# Patient Record
Sex: Male | Born: 1980 | Hispanic: Yes | Marital: Married | Smoking: Never smoker
Health system: Southern US, Community
[De-identification: ages and names within clinical notes are randomized; demographics above are authoritative.]

## PROBLEM LIST (undated history)

## (undated) HISTORY — PX: APPENDECTOMY: SHX54

## (undated) HISTORY — PX: BACK SURGERY: SHX140

---

## 2011-08-19 ENCOUNTER — Emergency Department (HOSPITAL_COMMUNITY): Payer: Worker's Compensation

## 2011-08-19 ENCOUNTER — Other Ambulatory Visit: Payer: Self-pay

## 2011-08-19 ENCOUNTER — Emergency Department (HOSPITAL_COMMUNITY)
Admission: EM | Admit: 2011-08-19 | Discharge: 2011-08-20 | Disposition: A | Discharge status: Home / Self Care | Payer: Worker's Compensation | Attending: Emergency Medicine | Admitting: Emergency Medicine

## 2011-08-19 DIAGNOSIS — M545 Low back pain, unspecified: Secondary | ICD-10-CM | POA: Insufficient documentation

## 2011-08-19 DIAGNOSIS — M542 Cervicalgia: Secondary | ICD-10-CM | POA: Insufficient documentation

## 2011-08-19 DIAGNOSIS — M25559 Pain in unspecified hip: Secondary | ICD-10-CM | POA: Insufficient documentation

## 2011-08-19 DIAGNOSIS — M79609 Pain in unspecified limb: Secondary | ICD-10-CM | POA: Insufficient documentation

## 2011-08-19 DIAGNOSIS — S0100XA Unspecified open wound of scalp, initial encounter: Secondary | ICD-10-CM | POA: Insufficient documentation

## 2011-08-19 DIAGNOSIS — T07XXXA Unspecified multiple injuries, initial encounter: Secondary | ICD-10-CM

## 2011-08-19 DIAGNOSIS — M25552 Pain in left hip: Secondary | ICD-10-CM

## 2011-08-19 DIAGNOSIS — W19XXXA Unspecified fall, initial encounter: Secondary | ICD-10-CM

## 2011-08-19 DIAGNOSIS — S0083XA Contusion of other part of head, initial encounter: Secondary | ICD-10-CM | POA: Insufficient documentation

## 2011-08-19 DIAGNOSIS — R404 Transient alteration of awareness: Secondary | ICD-10-CM | POA: Insufficient documentation

## 2011-08-19 DIAGNOSIS — S0003XA Contusion of scalp, initial encounter: Secondary | ICD-10-CM | POA: Insufficient documentation

## 2011-08-19 DIAGNOSIS — R109 Unspecified abdominal pain: Secondary | ICD-10-CM | POA: Insufficient documentation

## 2011-08-19 DIAGNOSIS — IMO0002 Reserved for concepts with insufficient information to code with codable children: Secondary | ICD-10-CM

## 2011-08-19 DIAGNOSIS — W138XXA Fall from, out of or through other building or structure, initial encounter: Secondary | ICD-10-CM | POA: Insufficient documentation

## 2011-08-19 DIAGNOSIS — R220 Localized swelling, mass and lump, head: Secondary | ICD-10-CM | POA: Insufficient documentation

## 2011-08-19 DIAGNOSIS — M549 Dorsalgia, unspecified: Secondary | ICD-10-CM

## 2011-08-19 DIAGNOSIS — R51 Headache: Secondary | ICD-10-CM | POA: Insufficient documentation

## 2011-08-19 LAB — POCT I-STAT, CHEM 8
BUN: 13 mg/dL (ref 6–23)
Calcium, Ion: 1.2 mmol/L (ref 1.12–1.32)
Glucose, Bld: 105 mg/dL — ABNORMAL HIGH (ref 70–99)
HCT: 45 % (ref 39.0–52.0)
TCO2: 26 mmol/L (ref 0–100)

## 2011-08-19 LAB — CBC
HCT: 41.7 % (ref 39.0–52.0)
MCV: 84.8 fL (ref 78.0–100.0)
Platelets: 263 10*3/uL (ref 150–400)
RBC: 4.92 MIL/uL (ref 4.22–5.81)
RDW: 12.8 % (ref 11.5–15.5)
WBC: 7.9 10*3/uL (ref 4.0–10.5)

## 2011-08-19 LAB — URINALYSIS, MICROSCOPIC ONLY
Bilirubin Urine: NEGATIVE
Nitrite: NEGATIVE
Specific Gravity, Urine: 1.016 (ref 1.005–1.030)
Urobilinogen, UA: 1 mg/dL (ref 0.0–1.0)
pH: 7.5 (ref 5.0–8.0)

## 2011-08-19 LAB — RAPID URINE DRUG SCREEN, HOSP PERFORMED
Amphetamines: NOT DETECTED
Barbiturates: NOT DETECTED
Benzodiazepines: NOT DETECTED

## 2011-08-19 LAB — HEPATIC FUNCTION PANEL
ALT: 55 U/L — ABNORMAL HIGH (ref 0–53)
AST: 35 U/L (ref 0–37)
Albumin: 4.2 g/dL (ref 3.5–5.2)
Total Protein: 8.3 g/dL (ref 6.0–8.3)

## 2011-08-19 LAB — BASIC METABOLIC PANEL
BUN: 12 mg/dL (ref 6–23)
CO2: 25 mEq/L (ref 19–32)
Calcium: 9.6 mg/dL (ref 8.4–10.5)
Creatinine, Ser: 0.78 mg/dL (ref 0.50–1.35)
GFR calc Af Amer: >90 mL/min (ref >90)
GFR calc non Af Amer: >90 mL/min (ref >90)
Glucose, Bld: 100 mg/dL — ABNORMAL HIGH (ref 70–99)

## 2011-08-19 LAB — ETHANOL: Alcohol, Ethyl (B): <11 mg/dL (ref 0–11)

## 2011-08-19 LAB — LACTIC ACID, PLASMA: Lactic Acid, Venous: 1.4 mmol/L (ref 0.5–2.2)

## 2011-08-19 MED ORDER — ONDANSETRON HCL 4 MG/2ML IJ SOLN
4.0000 mg | Freq: Once | INTRAMUSCULAR | Status: AC
  Administered 2011-08-19: 4 mg via INTRAVENOUS
  Filled 2011-08-19: qty 2

## 2011-08-19 MED ORDER — HYDROCODONE-ACETAMINOPHEN 5-325 MG PO TABS
1.0000 | ORAL_TABLET | ORAL | Status: DC | PRN
  Administered 2011-08-20: 1 via ORAL
  Filled 2011-08-19: qty 2

## 2011-08-19 MED ORDER — TETANUS-DIPHTH-ACELL PERTUSSIS 5-2.5-18.5 LF-MCG/0.5 IM SUSP
0.5000 mL | Freq: Once | INTRAMUSCULAR | Status: DC
  Filled 2011-08-19: qty 0.5

## 2011-08-19 MED ORDER — IOHEXOL 300 MG/ML  SOLN
80.0000 mL | Freq: Once | INTRAMUSCULAR | Status: AC | PRN
  Administered 2011-08-19: 80 mL via INTRAVENOUS

## 2011-08-19 MED ORDER — SODIUM CHLORIDE 0.9 % IV BOLUS (SEPSIS)
1000.0000 mL | Freq: Once | INTRAVENOUS | Status: AC
  Administered 2011-08-19: 1000 mL via INTRAVENOUS

## 2011-08-19 MED ORDER — MORPHINE SULFATE 4 MG/ML IJ SOLN
4.0000 mg | Freq: Once | INTRAMUSCULAR | Status: AC
  Administered 2011-08-19: 4 mg via INTRAVENOUS
  Filled 2011-08-19: qty 1

## 2011-08-19 MED ORDER — MORPHINE SULFATE 4 MG/ML IJ SOLN
6.0000 mg | Freq: Once | INTRAMUSCULAR | Status: AC
  Administered 2011-08-19: 6 mg via INTRAVENOUS
  Filled 2011-08-19: qty 2

## 2011-08-19 MED ORDER — MORPHINE SULFATE 4 MG/ML IJ SOLN: 6.0000 mg | Freq: Once | INTRAMUSCULAR | Status: DC

## 2011-08-19 MED ORDER — HYDROCODONE-ACETAMINOPHEN 5-325 MG PO TABS
1.0000 | ORAL_TABLET | Freq: Once | ORAL | Status: AC
  Administered 2011-08-20: 1 via ORAL
  Filled 2011-08-19: qty 1

## 2011-08-19 MED ORDER — MORPHINE SULFATE 4 MG/ML IJ SOLN
6.0000 mg | INTRAMUSCULAR | Status: DC | PRN
  Administered 2011-08-19: 6 mg via INTRAVENOUS
  Filled 2011-08-19: qty 2

## 2011-08-19 MED ORDER — HYDROCODONE-ACETAMINOPHEN 5-500 MG PO TABS
1.0000 | ORAL_TABLET | Freq: Four times a day (QID) | ORAL | Status: DC | PRN
Start: 2011-08-19 — End: 2011-08-26

## 2011-08-19 NOTE — Progress Notes (Signed)
This patient has received 75 ml's of IV Omnipaque 300 (type of contrast) contrast extravasation into LEFT antecubital (part of body) during a CT chest/abd/pelvis  exam.  The exam was performed on (date)08/19/2011   Site / affected area assessed by Dr Jill Poling     and Lysle Morales

## 2011-08-19 NOTE — ED Notes (Signed)
Pt returns from CT.

## 2011-08-19 NOTE — ED Notes (Signed)
Pt transported to radiology.

## 2011-08-19 NOTE — ED Provider Notes (Signed)
History     CSN: [id number]  Arrival date & time 08/19/11  1235   First MD Initiated Contact with Patient 08/19/11 1241      Chief Complaint  Patient presents with  . Fall    (Consider location/radiation/quality/duration/timing/severity/associated sxs/prior treatment) Patient is a 31 y.o. male presenting with fall. The history is provided by the patient and the EMS personnel.  Fall The accident occurred less than 1 hour ago. The fall occurred while walking. Distance fallen: from 2nd story roof  Impact surface: landed on a car then onto the ground. The volume of blood lost was minimal. Point of impact: left side. The pain is present in the head, neck and left hip. The pain is moderate. He was not ambulatory at the scene. There was no entrapment after the fall. Associated symptoms include headaches and loss of consciousness. Pertinent negatives include no visual change, no fever, no numbness, no abdominal pain, no bowel incontinence, no nausea, no vomiting, no hematuria, no hearing loss and no tingling. Exacerbated by: movement. Treatment on scene includes a c-collar and a backboard. He has tried nothing for the symptoms.    No past medical history on file.  No past surgical history on file.  No family history on file.  History  Substance Use Topics  . Smoking status: Not on file  . Smokeless tobacco: Not on file  . Alcohol Use: Not on file      Review of Systems  Constitutional: Negative for fever, chills, activity change, appetite change and fatigue.  HENT: Positive for neck pain. Negative for congestion, sore throat, rhinorrhea and neck stiffness.   Eyes: Negative for photophobia, redness and visual disturbance.  Respiratory: Negative for cough, shortness of breath and wheezing.   Cardiovascular: Negative for chest pain, palpitations and leg swelling.  Gastrointestinal: Negative for nausea, vomiting, abdominal pain, diarrhea, constipation, blood in stool and bowel  incontinence.  Genitourinary: Negative for dysuria, urgency, hematuria and flank pain.  Musculoskeletal: Positive for back pain (low back) and arthralgias (left hip pain).  Skin: Positive for wound (scalp). Negative for rash.  Neurological: Positive for loss of consciousness and headaches. Negative for dizziness, tingling, seizures, facial asymmetry, speech difficulty, weakness, light-headedness and numbness.  Psychiatric/Behavioral: Negative for confusion.  All other systems reviewed and are negative.    Allergies  Review of patient's allergies indicates not on file.  Home Medications  No current outpatient prescriptions on file.  BP 148/88  Temp(Src) 97.1 F (36.2 C) (Oral)  Resp 20  SpO2 98%  Physical Exam  Nursing note and vitals reviewed. Constitutional: He is oriented to person, place, and time. Vital signs are normal. He appears well-developed and well-nourished.  Non-toxic appearance. No distress.  HENT:  Head: Head is with contusion (large contusion and area of soft tissue swelling left posterior upper scalp. ) and with laceration (1.5cm laceration in area of abrasion and soft tissue swelling). Head is without raccoon's eyes and without Battle's sign.    Right Ear: Hearing, tympanic membrane, external ear and ear canal normal. No hemotympanum.  Left Ear: Hearing, tympanic membrane, external ear and ear canal normal. No hemotympanum.  Nose: Nose normal. No nasal deformity or septal deviation.  Mouth/Throat: Oropharynx is clear and moist.  Eyes: Conjunctivae and EOM are normal. Pupils are equal, round, and reactive to light. No scleral icterus.  Neck: No JVD present. Spinous process tenderness (diffuse cervical spine tenderness without deformity) present.  Cardiovascular: Normal rate, regular rhythm, normal heart sounds and intact distal pulses.  Exam reveals no decreased pulses.   No murmur heard. Pulmonary/Chest: Effort normal and breath sounds normal. No respiratory  distress. He has no wheezes. He has no rales.  Abdominal: Soft. Bowel sounds are normal. He exhibits no distension. There is no tenderness. There is no rebound and no guarding.  Genitourinary: Rectum normal, testes normal and penis normal. Rectal exam shows anal tone normal.  Musculoskeletal: Normal range of motion.       Right hip: Normal.       Left hip: He exhibits tenderness (moderate tenderness with palpation of lateral left hip). He exhibits no crepitus, no deformity and no laceration.       Left knee: Normal.       Left ankle: Normal.       Thoracic back: Normal.       Lumbar back: He exhibits bony tenderness (lumbar spine). He exhibits no swelling, no edema, no deformity and no laceration.       Back:       Left upper leg: He exhibits tenderness (with palpation of thigh). He exhibits no swelling, no edema, no deformity and no laceration.       Left lower leg: Normal.  Neurological: He is alert and oriented to person, place, and time. He has normal strength. No cranial nerve deficit. GCS eye subscore is 4. GCS verbal subscore is 5. GCS motor subscore is 6.  Skin: Skin is warm and dry. No rash noted. He is not diaphoretic.  Psychiatric: He has a normal mood and affect.    ED Course  LACERATION REPAIR Date/Time: 08/19/2011 3:38 PM Performed by: Verne Carrow Authorized by: Wyona Almas B Consent: Verbal consent obtained. Risks and benefits: risks, benefits and alternatives were discussed Consent given by: patient Patient understanding: patient states understanding of the procedure being performed Patient consent: the patient's understanding of the procedure matches consent given Procedure consent: procedure consent matches procedure scheduled Site marked: the operative site was marked Imaging studies: imaging studies available Patient identity confirmed: verbally with patient, arm band, provided demographic data and hospital-assigned identification number Body area:  head/neck Location details: scalp Laceration length: 1.5 cm Foreign bodies: no foreign bodies Tendon involvement: none Nerve involvement: none Vascular damage: no Anesthesia: local infiltration Local anesthetic: lidocaine 1% with epinephrine Anesthetic total: 2 ml Patient sedated: no Preparation: Patient was prepped and draped in the usual sterile fashion. Irrigation solution: saline Irrigation method: syringe Amount of cleaning: standard Debridement: none Degree of undermining: none Skin closure: staples (3 staples) Approximation: close Approximation difficulty: simple Patient tolerance: Patient tolerated the procedure well with no immediate complications.   (including critical care time)  Labs Reviewed  BASIC METABOLIC PANEL - Abnormal; Notable for the following:    Glucose, Bld 100 (*)    All other components within normal limits  HEPATIC FUNCTION PANEL - Abnormal; Notable for the following:    ALT 55 (*)    Alkaline Phosphatase 118 (*)    All other components within normal limits  POCT I-STAT, CHEM 8 - Abnormal; Notable for the following:    Glucose, Bld 105 (*)    All other components within normal limits  CBC  URINALYSIS, WITH MICROSCOPIC  LACTIC ACID, PLASMA  PROTIME-INR  URINE RAPID DRUG SCREEN (HOSP PERFORMED)  ETHANOL   Dg Hip Complete Left  08/19/2011  *RADIOLOGY REPORT*  Clinical Data: Status post fall.  Pain.  LEFT HIP - COMPLETE 2+ VIEW  Comparison: None.  Findings: Imaged bones, joints and soft tissues appear normal.  IMPRESSION: Negative exam.  Original Report Authenticated By: Bernadene Bell. Maricela Curet, M.D.   Ct Head Wo Contrast  08/19/2011  *RADIOLOGY REPORT*  Clinical Data:  Status post fall with a blow to the head and a hematoma over the left occiput.  CT HEAD WITHOUT CONTRAST CT CERVICAL SPINE WITHOUT CONTRAST  Technique:  Multidetector CT imaging of the head and cervical spine was performed following the standard protocol without intravenous contrast.   Multiplanar CT image reconstructions of the cervical spine were also generated.  Comparison:  None.  CT HEAD  Findings: Scalp hematoma is seen over the left parietal bone.  No underlying fracture is identified.  The calvarium is intact.  No evidence of acute intracranial abnormality including acute infarction, hemorrhage, mass lesion, mass effect, midline shift or abnormal extra-axial fluid collection.  No hydrocephalus or pneumocephalus.  IMPRESSION: Scalp hematoma over the left parietal bone without underlying fracture or intracranial abnormality.  CT CERVICAL SPINE  Findings: There is no fracture or subluxation of the cervical spine. No epidural hematoma is visualized.  Lung apices are clear. Synostosis between the right 2nd and 1st ribs is noted.  IMPRESSION:  1.  No acute finding. 2.  Synostosis right first and second ribs incidentally noted.  Original Report Authenticated By: Bernadene Bell. Maricela Curet, M.D.   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  Sagittal and coronal MPR images reconstructed from axial data set.  No IV contrast is seen within this patient despite injection.  This is indicative of either internal or external extravasation of contrast material. The patient was examined by Dr. Amil Amen and Jeananne Rama PA. Injection was not repeated.  Contrast: 80mL OMNIPAQUE IOHEXOL 300 MG/ML IV SOLN No oral contrast administered.  Comparison:  None  CT CHEST  Findings: Aorta normal caliber. No mediastinal hematoma. Beam hardening artifacts at lower thorax from inclusion of patient's arms within imaged field. No definite thoracic adenopathy. Dependent atelectasis in posterior lungs bilaterally. Generally low lung volumes.  No definite pulmonary infiltrate, pleural effusion, or pneumothorax. 5 mm left upper lobe pulmonary nodule image 25. No definite fractures.  IMPRESSION: Dependent atelectasis. No additional acute intrathoracic abnormalities. Nonspecific 5 mm left upper lobe pulmonary nodule, recommendation below.  If the patient is at high risk for bronchogenic carcinoma, follow- up chest CT at 6-12 months is recommended.  If the patient is at low risk for bronchogenic carcinoma, follow-up chest CT at 12 months is recommended.  This recommendation follows the consensus statement: Guidelines for Management of Small Pulmonary Nodules Detected on CT Scans: A Statement from the Fleischner Society as published in Radiology 2005; 237:395-400. Online at: DietDisorder.cz.  CT ABDOMEN AND PELVIS  Findings:  Extensive streak artifacts from patient's arms traverse the upper and mid abdomen. Assessment of solid organs limited by lack of IV contrast. Within these limitations, no definite acute focal abnormalities of the liver, spleen, pancreas, kidneys, or adrenal glands. Numerous normal-sized lymph nodes medial to right colon. Bladder unremarkable. No mass, adenopathy, free fluid or inflammatory process. No free intraperitoneal air. No acute fractures identified.  IMPRESSION: No definite acute intra abdominal or intrapelvic abnormalities identified by noncontrast CT as above.  Original Report Authenticated By: Lollie Marrow, M.D.   Ct Cervical  Spine Wo Contrast  08/19/2011  *RADIOLOGY REPORT*  Clinical Data:  Status post fall with a blow to the head and a hematoma over the left occiput.  CT HEAD WITHOUT CONTRAST CT CERVICAL SPINE WITHOUT CONTRAST  Technique:  Multidetector CT imaging of the head and cervical spine was performed following the standard protocol without intravenous contrast.  Multiplanar CT image reconstructions of the cervical spine were also generated.  Comparison:  None.  CT HEAD   Findings: Scalp hematoma is seen over the left parietal bone.  No underlying fracture is identified.  The calvarium is intact.  No evidence of acute intracranial abnormality including acute infarction, hemorrhage, mass lesion, mass effect, midline shift or abnormal extra-axial fluid collection.  No hydrocephalus or pneumocephalus.  IMPRESSION: Scalp hematoma over the left parietal bone without underlying fracture or intracranial abnormality.  CT CERVICAL SPINE  Findings: There is no fracture or subluxation of the cervical spine. No epidural hematoma is visualized.  Lung apices are clear. Synostosis between the right 2nd and 1st ribs is noted.  IMPRESSION:  1.  No acute finding. 2.  Synostosis right first and second ribs incidentally noted.  Original Report Authenticated By: Bernadene Bell. Maricela Curet, M.D.   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  Sagittal and coronal MPR images reconstructed from axial data set.  No IV contrast is seen within this patient despite injection.  This is indicative of either internal or external extravasation of contrast material. The patient was examined by Dr. Amil Amen and Jeananne Rama PA. Injection was not repeated.  Contrast: 80mL OMNIPAQUE IOHEXOL 300 MG/ML IV SOLN No oral contrast administered.  Comparison:  None  CT CHEST  Findings: Aorta normal caliber. No mediastinal hematoma. Beam hardening artifacts at lower thorax from inclusion of patient's arms within imaged field. No definite thoracic adenopathy. Dependent atelectasis in posterior lungs bilaterally. Generally low lung volumes. No definite pulmonary infiltrate, pleural effusion, or pneumothorax. 5 mm left upper lobe pulmonary  nodule image 25. No definite fractures.  IMPRESSION: Dependent atelectasis. No additional acute intrathoracic abnormalities. Nonspecific 5 mm left upper lobe pulmonary nodule, recommendation below.  If the patient is at high risk for bronchogenic carcinoma, follow- up chest CT at 6-12 months is recommended.  If the patient is at low risk for bronchogenic carcinoma, follow-up chest CT at 12 months is recommended.  This recommendation follows the consensus statement: Guidelines for Management of Small Pulmonary Nodules Detected on CT Scans: A Statement from the Fleischner Society as published in Radiology 2005; 237:395-400. Online at: DietDisorder.cz.  CT ABDOMEN AND PELVIS  Findings:  Extensive streak artifacts from patient's arms traverse the upper and mid abdomen. Assessment of solid organs limited by lack of IV contrast. Within these limitations, no definite acute focal abnormalities of the liver, spleen, pancreas, kidneys, or adrenal glands. Numerous normal-sized lymph nodes medial to right colon. Bladder unremarkable. No mass, adenopathy, free fluid or inflammatory process. No free intraperitoneal air. No acute fractures identified.  IMPRESSION: No definite acute intra abdominal or intrapelvic abnormalities identified by noncontrast CT as above.  Original Report Authenticated By: Lollie Marrow, M.D.   Dg Pelvis Portable  08/19/2011  *RADIOLOGY REPORT*  Clinical Data: Fall from roof.  Left  hip and pelvic pain.  PORTABLE PELVIS  Comparison: None.  Findings: The patient is rotated to the left during imaging, mildly reducing diagnostic sensitivity and specificity.  The hips are internally rotated.  Accounting for these factors, no fracture is observed.  IMPRESSION:  1.  No pelvic fracture is observed.  Original Report Authenticated By: Dellia Cloud, M.D.   Dg Chest Portable 1 View  08/19/2011  *RADIOLOGY REPORT*  Clinical Data: Status post fall from the roof with  pain in the left hip and pelvis  PORTABLE CHEST - 1 VIEW  Comparison: None.  Findings: The cardiomediastinal silhouette is within normal limits. The lung fields appear clear with no signs of focal infiltrate or congestive failure. The right costophrenic angle is excluded from the film.  No left pleural fluid is seen.  No definite rib fracture is seen.  Visualized portions of the thoracic spine appear intact.  IMPRESSION: No focal worrisome abnormality identified.  Original Report Authenticated By: Bertha Stakes, M.D.   Dg Knee Complete 4 Views Left  08/19/2011  *RADIOLOGY REPORT*  Clinical Data: Status post fall.  LEFT KNEE - COMPLETE 4+ VIEW  Comparison: None.  Findings: Imaged bones, joints and soft tissues appear normal.  IMPRESSION: Negative exam.  Original Report Authenticated By: Bernadene Bell. D'ALESSIO, M.D.     1. Fall   2. Back pain   3. Left hip pain   4. Multiple contusions   5. Laceration       MDM  30yom with no significant PMH who presents to the ED due to fall from 2nd story roof of building (approx 84ft per EMS). Pt was working on roof lost footing as he tripped over a load of roofing shingles and fell landing on a car initially and subsequently falling to the ground. Per EMS a bystander reported brief LOC, but pt awake alert and following commands the entire time with EMS. He has a headache, neck pain and left hip pain. Laceration with contusion and abrasion to scalp. Pain with palpation of left hip. Pulses intact. Getting trauma scans and plain films of left hip and knee. Will give pain meds and update tetanus.   Called by radiologist. Pt accidentally received infusion of contrast load at IV site in left arm. Radiologist recs monitoring for 12 hours. Radiologist felt like he had a very good exam to read despite lack of adequate contrast infusion. RN elevated arm and applied ice dressing.   CT scans neg for acute injury.   Lac repaired with 3 staples.   At 3:42 PM pt  reassessed and no evidence of compartment syndrome of left arm 2/2 IV contrast.   Sending pt to CDU to monitor his arm to r/o compartment syndrome. Plan to d/c after 12h.        Verne Carrow, MD 08/19/11 [phone]

## 2011-08-19 NOTE — ED Notes (Signed)
Pt roofer, fall from 2 story apt builing, landed on car below, + LOC, alert on EMS arrival , pain in back of head, left hip and thigh

## 2011-08-19 NOTE — ED Notes (Signed)
Family at beside. Family given emotional support.  Pt wife at the bedside

## 2011-08-19 NOTE — ED Provider Notes (Signed)
Assumed patient care from Wyona Almas, MD (resident).  31 year old male who had a recent fall from when tripped on roofing shingles.  Patient has been thoroughly evaluated and had appropriate imagings for the evaluation of his fall. Received IV contrast to his left arm and developed associated swelling. Patient is being sent to the CDU to rule out compartment syndrome. Pain is to keep patient's on elevated for the next 12 hours with periodic neurovascular checks. Plan to discharge at 0400.  5:41 PM Moderate swelling noted to left upper and lower arm. Sensation is intact throughout extremities and to distal fingers with brisk capillary refills. Patient denies any significant pain at this time. Patient is made n.p.o. for the meantime. I will continue to recheck his extremities every 2 hours.  7:25 PM On reevaluation, left forearm with moderate swelling however brisk capillary refill, 2+ radial pulse and sensation is intact. I readjust patient arm and keeps it at a 45 elevation. Ice pack in place. Pain medication given.  9:19 PM On reevaluation, left forearm was mildly decreased swelling subjective pain improvement. Sensation is intact, radial pulse 2+ with brisk capillary refills. Ace wrap was readjusted and less tighten.  Patient is allowed to eat.  11:20 PM Patient endorsed pain pain intensity to his left arm and forearm. On evaluation, same ED edema noted to his left arm and left arm. Sensation is intact throughout. Brisk capillary refills to all distal fingers. 2+ radial pulse. Patient able to make a fist. Pain medication given. Food given.  11:40 PM Patient report given to Dr. Aundra Millet current, who will continue to monitor his care. Anticipate patient to be discharged at 4 AM if his condition improves. Discharge instruction has been prepared.  Fayrene Helper, PA-C 08/19/11 2342

## 2011-08-19 NOTE — ED Notes (Signed)
Pt is able to tolerate po fluids.

## 2011-08-19 NOTE — ED Notes (Signed)
Gave ECG to Dr. Radford Pax after I performed. 1:10 pm JG.

## 2011-08-19 NOTE — ED Notes (Signed)
Ice pack applied to lt forearm, kept lt arm  Elevated on a pillow. Swelling remains on the lt forearm. Will continue to monitor

## 2011-08-19 NOTE — Progress Notes (Signed)
Orthopedic Tech Progress Note Patient Details:  Luis Bartlett December 14, 1980 [id number]      [name] 08/19/2011, 12:44 PMtrauma fall

## 2011-08-19 NOTE — ED Notes (Signed)
Pt resting quietly, reports having pain to left arm 7/10. Pt left arm remains swollen, pt has ace wrap to mid forearm. Pt has palpable strong radial pulse and cap refill > 2 seconds. Ace wrap checked for tightness and is stretchable for movement. Pt has ice pack applied to arm with verbal understanding of intermittently placement. Plan of care is updated with verbal understanding, will continue to monitor pt.

## 2011-08-19 NOTE — ED Notes (Signed)
Pt has no increase in swelling to left arm, new ice pack given and pt continues to have arm elevated. Pt continues to have good capillary refill and palpable radial pulse. Pt denies any numbness to arm, family at bedside and will continue to monitor pt.

## 2011-08-19 NOTE — ED Notes (Signed)
Pt continues to be in radiology 

## 2011-08-20 MED ORDER — HYDROCODONE-ACETAMINOPHEN 5-325 MG PO TABS: 1.0000 | ORAL_TABLET | Freq: Four times a day (QID) | ORAL | Status: DC | PRN

## 2011-08-20 NOTE — ED Provider Notes (Signed)
I saw and evaluated the patient, reviewed the resident's note and I agree with the findings and plan.   .Face to face Exam:  General:  Awake HEENT:  Atraumatic Resp:  Normal effort Abd:  Nondistended Neuro:No focal weakness Lymph: No adenopathy   Nelia Shi, MD 08/20/11 2013

## 2011-08-20 NOTE — ED Provider Notes (Signed)
Medical screening examination/treatment/procedure(s) were performed by non-physician practitioner and as supervising physician I was immediately available for consultation/collaboration.   Loren Racer, MD 08/20/11 0001

## 2011-08-20 NOTE — ED Notes (Signed)
Patient states swelling to left hand has gotten worse. Rates the pain as 5/10. Throbbing with numbness and tingling. Also complaining of head pain in occipital lobe. Swelling, raised area noted with dry blood

## 2011-08-20 NOTE — ED Provider Notes (Signed)
Patient was reassessed and continued to feel improvement. Patient strong pulses and sensation intact. He was discharged in good condition with pain medication.  Cyndra Numbers, MD 08/20/11 [phone]

## 2011-08-20 NOTE — ED Notes (Addendum)
Ace wrap removed. Decreased swelling to hand noted. Patient also states tightness has decreased. Forearm firm to palpation.  Complaining of throbbing pain in head.

## 2011-08-25 ENCOUNTER — Emergency Department: Payer: Self-pay | Admitting: Emergency Medicine

## 2011-08-26 ENCOUNTER — Emergency Department (HOSPITAL_COMMUNITY)
Admission: EM | Admit: 2011-08-26 | Discharge: 2011-08-26 | Disposition: A | Discharge status: Home / Self Care | Payer: Worker's Compensation | Attending: Emergency Medicine | Admitting: Emergency Medicine

## 2011-08-26 ENCOUNTER — Encounter (HOSPITAL_COMMUNITY): Payer: Self-pay

## 2011-08-26 ENCOUNTER — Emergency Department (HOSPITAL_COMMUNITY): Payer: Worker's Compensation

## 2011-08-26 DIAGNOSIS — R42 Dizziness and giddiness: Secondary | ICD-10-CM | POA: Insufficient documentation

## 2011-08-26 DIAGNOSIS — Z79899 Other long term (current) drug therapy: Secondary | ICD-10-CM | POA: Insufficient documentation

## 2011-08-26 DIAGNOSIS — W138XXA Fall from, out of or through other building or structure, initial encounter: Secondary | ICD-10-CM | POA: Insufficient documentation

## 2011-08-26 DIAGNOSIS — R112 Nausea with vomiting, unspecified: Secondary | ICD-10-CM | POA: Insufficient documentation

## 2011-08-26 DIAGNOSIS — R51 Headache: Secondary | ICD-10-CM | POA: Insufficient documentation

## 2011-08-26 DIAGNOSIS — S0003XA Contusion of scalp, initial encounter: Secondary | ICD-10-CM | POA: Insufficient documentation

## 2011-08-26 DIAGNOSIS — S060X9A Concussion with loss of consciousness of unspecified duration, initial encounter: Secondary | ICD-10-CM | POA: Insufficient documentation

## 2011-08-26 DIAGNOSIS — S060XAA Concussion with loss of consciousness status unknown, initial encounter: Secondary | ICD-10-CM | POA: Insufficient documentation

## 2011-08-26 DIAGNOSIS — S0083XA Contusion of other part of head, initial encounter: Secondary | ICD-10-CM | POA: Insufficient documentation

## 2011-08-26 MED ORDER — MECLIZINE HCL 25 MG PO TABS
50.0000 mg | ORAL_TABLET | Freq: Once | ORAL | Status: AC
  Administered 2011-08-26: 50 mg via ORAL
  Filled 2011-08-26: qty 2

## 2011-08-26 MED ORDER — MECLIZINE HCL 25 MG PO TABS: 25.0000 mg | ORAL_TABLET | Freq: Three times a day (TID) | ORAL | Status: AC | PRN

## 2011-08-26 MED ORDER — CYCLOBENZAPRINE HCL 10 MG PO TABS: 10.0000 mg | ORAL_TABLET | Freq: Two times a day (BID) | ORAL | Status: AC | PRN

## 2011-08-26 NOTE — ED Notes (Signed)
C/o h/a, dizziness, and n/v for several days. Pt was seen in dept on 2/5 after falling off a roof and having laceration stapled on the posterior head. Pt states sx persisted after that visit. States can't keep anything PO down. States very dizzy when getting up and laying down. Also c/o persistent h/a. Pt is neurologically intact and is negative for stroke. Pt ambulatory and appears in no acute distress, will cont to monitor.

## 2011-08-26 NOTE — ED Provider Notes (Signed)
CT results reviewed and discussed with patient.  Dizziness improved with meclizine.  Will discharge home with prescription for meclizine and flexeril for muscle spasms in leg.  Jimmye Norman, NP 08/26/11 [phone]

## 2011-08-26 NOTE — ED Notes (Signed)
Pt complains of dizziness and headache for four days.

## 2011-08-26 NOTE — ED Notes (Signed)
Pt reports that he still feels dizzy and rates headache at 4/10.  Denies N/V

## 2011-08-26 NOTE — ED Notes (Signed)
Pt reports that he still has a ltitle dizziness, headache rated at 4/10 and mild nausea.  No emesis noted at this time.

## 2011-08-26 NOTE — ED Provider Notes (Signed)
History     CSN: [id number]  Arrival date & time 08/26/11  1236   First MD Initiated Contact with Patient 08/26/11 1452      Chief Complaint  Patient presents with  . Dizziness  . Headache    (Consider location/radiation/quality/duration/timing/severity/associated sxs/prior treatment) Patient is a 31 y.o. male presenting with headaches. The history is provided by the patient. The history is limited by a language barrier. No language interpreter was used.  Headache  This is a new problem. The problem occurs constantly. Associated symptoms include nausea and vomiting. Pertinent negatives include no fever.  Pt states he fell off the roof a week ago. Was evaluated at that time with x-rays and CT scan of the head. Was discharged home with pain medications  After negative results. Pt states since then, his head continues to hurt, mainly on left side. Reports nausea, vomiting, dizziness.  Denies new injuries. States pain medications not helping  History reviewed. No pertinent past medical history.  History reviewed. No pertinent past surgical history.  History reviewed. No pertinent family history.  History  Substance Use Topics  . Smoking status: Never Smoker   . Smokeless tobacco: Not on file  . Alcohol Use: Yes      Review of Systems  Constitutional: Negative for fever and chills.  Eyes: Negative for pain and visual disturbance.  Respiratory: Negative.   Cardiovascular: Negative.   Gastrointestinal: Positive for nausea and vomiting.  Genitourinary: Negative.   Musculoskeletal: Negative.   Skin: Negative.   Neurological: Positive for dizziness, light-headedness and headaches.  Psychiatric/Behavioral: Negative.     Allergies  Review of patient's allergies indicates no known allergies.  Home Medications   Current Outpatient Rx  Name Route Sig Dispense Refill  . CYCLOBENZAPRINE HCL 10 MG PO TABS Oral Take 10 mg by mouth every 6 (six) hours as needed. For muscle spasms     . HYDROCODONE-ACETAMINOPHEN 5-325 MG PO TABS Oral Take 1-2 tablets by mouth every 6 (six) hours as needed. For pain    . MELOXICAM 7.5 MG PO TABS Oral Take 7.5 mg by mouth 2 (two) times daily as needed. For pain      BP 137/95  Pulse 113  Temp(Src) 97.4 F (36.3 C) (Oral)  Resp 16  SpO2 98%  Physical Exam  Nursing note and vitals reviewed. Constitutional: He is oriented to person, place, and time. He appears well-developed and well-nourished. No distress.  HENT:  Head: Normocephalic.  Right Ear: External ear normal.  Left Ear: External ear normal.  Nose: Nose normal.  Mouth/Throat: Oropharynx is clear and moist.       Hematoma to the left scalp, tender to palpation.   Eyes: Conjunctivae and EOM are normal. Pupils are equal, round, and reactive to light.  Neck: Normal range of motion. Neck supple.  Cardiovascular: Normal rate, regular rhythm and normal heart sounds.   Pulmonary/Chest: Effort normal and breath sounds normal. No respiratory distress.  Abdominal: Soft. Bowel sounds are normal. He exhibits no distension. There is no tenderness.  Musculoskeletal: Normal range of motion. He exhibits no edema and no tenderness.  Lymphadenopathy:    He has no cervical adenopathy.  Neurological: He is alert and oriented to person, place, and time. He has normal reflexes. No cranial nerve deficit. Coordination normal.       Normal finger to nose, no pronator drift.   Skin: Skin is warm and dry. No erythema.  Psychiatric: He has a normal mood and affect.    ED  Course  Procedures (including critical care time)  Pt with head injury 5 days ago. Continues to have severe headache, dizziness. Will repeat CT head.   Pt's care discussed with PA Katrinka Blazing at change of shift. Pt has no neuro findings.   No diagnosis found.    MDM          Lottie Mussel, PA 08/27/11 [phone]

## 2011-08-26 NOTE — Discharge Instructions (Signed)
Your CT did not show any abnormal findings. Continue ibuprofen and pain medications prescribed. Rest. Follow up with your primary care doctor.    Contusin y lesin cerebral (Concussion and Brain Injury)  Un golpe o una sacudida en la cabeza puede afectar el normal funcionamiento del cerebro. Este tipo de lesin se denomina "conmocin cerebral" o "traumatismo craneal cerrado" Este tipo de contusin no pone en peligro su vida. An as, los efectos de una contusin pueden ser graves.  CAUSAS Una concusin se produce por un traumatismo en la cabeza. El golpe puede ser directo o indirecto como se describe a continuacin.  Golpe directo (chocar con otro jugador en un partido de ftbol, un golpe en una pelea, golpearse contra una superficie dura).   Un golpe indirecto (la cabeza se Walt Disney rpida y violentamente de adelante hacia atrs, como en un choque automovilstico).  SNTOMAS El cerebro es muy complejo. Cada lesin de la cabeza es diferente. Algunos sntomas pueden aparecer de inmediato. Otros sntomas pueden no aparecer [employer] o semanas despus de la contusin. Los signos pueden ser difciles de Chief Strategy Officer. En un primer momento, los pacientes, familiares y profesionales tal vez los adviertan. Puede tener un aspecto saludable y Research scientist (life sciences) o sentir de modo diferente.  Los sntomas son temporarios pero generalmente duran [employer], semanas o ms. Los sntomas son:  Cefaleas leves que no se alivian.   Presentar ms dificultad que lo habitual para:   Recordar cosas.   Prestar atencin o concentrarse.   Organizar las tareas diarias.   Tomar decisiones y [employer].   Lentitud para pensar, actuar, hablar o leer.   Sentirse perdido o confuso.   Sentirse cansado [employer], falta de Engineer, drilling (fatiga).   Sentirse somnoliento.   Problemas para dormir.   Dormir ms tiempo que el habitual.   Duerme ms que lo habitual.   Problemas para conciliar el sueo.   Problemas para  dormir (insomnio).   Prdida del equilibrio, mareos.   Nuseas o vmitos.   Adormecimiento u hormigueo.   Mayor sensibilidad para:   Los ruidos.   La luz.   Las distracciones.  Entre otros sntomas se incluyen:  Problemas de la visin o cansancio en los ojos.   Prdida del sentido del gusto o Cabin crew.   Ruidos en el odo.   Cambios en el humor como sentirse triste, ansioso o indiferente.   Irritacin, enojo por cosas pequeas o sin motivos.   Falta de motivacin.  DIAGNSTICO El mdico diagnosticar conmocin cerebral o lesin cerebral leve basndose en la descripcin del traumatismo y los sntomas.  La evaluacin puede incluir:  Un escaneo cerebral para buscar seales de lesiones en el cerebro. Incluso si en el examen no se observan lesiones, podra tener una contusin.   Anlisis de sangre para asegurarse de que no existe otro problema.  TRATAMIENTO  Las personas que han sufrido una conmocin cerebral deben ser examinadas y [place]. La mayor parte de las personas que sufren conmocin cerebral son tratados en el servicio de emergencias o en el consultorio mdico. Training and development officer en el hospital por una noche para recibir un tratamiento ms intensivo.   El profesional le dar el alta con algunas instrucciones que deber seguir. Asegrese de seguirlas cuidadosamente.   Comunquele al profesional si toma medicamentos (prescriptos, de venta libre o "naturales") o si bebe alcohol o consume drogas ilegales. Tambin informe al profesional si toma anticoagulantes o aspirina. Estos medicamentos pueden aumentar la probabilidad de que existan complicaciones.  Esta es informacin importante que podra Licensed conveyancer.   Utilice los medicamentos de venta libre o de prescripcin para Chief Technology Officer, Environmental health practitioner o la [place], segn se lo indique el profesional que lo asiste.  PRONSTICO El tiempo que lleve la recuperacin depende de cada persona. Aunque la [employer]son  de las personas se recupera satisfactoriamente, la mejora depende de varios factores. Entre ellos se incluyen la gravedad de la contusin, la zona del cerebro lesionada, la edad y [place] de salud previo a la lesin.  Como todas las lesiones de la cabeza son [place], tambin lo es la recuperacin. La Harley[employer]son de las personas con lesiones leves se recupera por completo. La recuperacin puede demorar algn tiempo. En general, la recuperacin es ms lenta en las [employer]. Adems, a aquellas personas que ya han sufrido una contusin en el pasado, les llevar ms tiempo recuperarse de la lesin actual. La ansiedad y la depresin podrn tambin hacer ms difcil de superar los sntomas de la lesin cerebral. INSTRUCCIONES PARA EL CUIDADO DOMICILIARIO El regreso a las actividades habituales debe ser gradual. El cuerpo y el cerebro necesitan su tiempo para recuperarse.  Deben dormir las horas suficientes durante la noche y Systems analyst. El reposo ayuda al cerebro a curarse.   Evite quedarse despierto muy tarde por la noche.   Debe irse a dormir a la V[employer] Corporation de [address] y los fines de [place].   Promueva las siestas durante el da o momentos de descanso cuando parece cansado.   Limite las [employer] le exijan mucha concentracin (reposo cognitivo o cerebral). Aqu se incluye:   Tareas para el hogar o trabajos relacionados con el empleo.   Mirar televisin.   Trabajos en la computadora.   Deber evitar las actividades que puedan favorecer otra lesin, tales como los deportes de contacto o Teacher, adult education, a International aid/development worker profesional le indique que puede Journalist, newspaper. An despus de la Psychologist, educational, debe protegerse de una nueva lesin.   Pregunte cuando podr volver a conducir un automvil, una bicicleta u manipular equipos riesgosos. La capacidad para reaccionar puede ser ms lenta luego de una lesin cerebral.   Converse con el profesional acerca del mejor  momento para que retome la actividad escolar o [place].   Informe a los [employer], al departamento de enfermera de la escuela, al consejero escolar, Conservation officer, historic buildings acerca de los sntomas y Engineer, structural que tiene. Ellos deben ser instruidos para informar:   Aumento en los problemas de atencin o Librarian, academic.   Aumento en los problemas de memoria o en el aprendizaje de informacin nueva.   Necesita ms tiempo para completar tareas o encargos.   Aumento de la irritabilidad o disminucin de la capacidad para Social worker.   Aparecen nuevos sntomas.   Tome slo aquellos medicamentos que le han autorizado.   No beba alcohol hasta que el profesional le indique que ya no corre Surveyor, minerals. El alcohol y ciertas drogas podrn Surveyor, minerals recuperacin y ponerlo en riesgo de otra lesin.   Si le resulta ms difcil que lo habitual recordar las cosas, escrbalas.   Si se distrae fcilmente, trate de hacer una cosa a la vez. Por ejemplo, trate de no mirar televisin mientras planea la cena.   Consulte con familiares y amigos si debe tomar decisiones importantes.   Cumpla con las visitas de control tal como se le indic. Se recomienda realizar varias evaluaciones de los sntomas para favorecer su recuperacin.  PREVENCIN Proteja su cabeza de traumatismos futuros. Es muy importante que evite otro golpe en la cabeza antes de la recuperacin. En casos raros, un nuevo traumatismo ha ocasionado daos cerebrales permanentes, hinchazn del cerebro y [employer]. Evite los traumatismos usando:   Cinturones de seguridad al viajar en automvil.   Beba alcohol con moderacin.   Utilice cascos al andar en bicicleta, esquiar, patinar en tabla, usar patines o al [employer] Corporation similares.   Medidas de seguridad en el hogar.   Para evitar tropezar no tenga desorden en pisos y escaleras.   Coloque barras en los baos y pasamanos en las escaleras.   Ponga alfombras  antideslizantes en pisos y baeras.   Mejore la iluminacin en zonas de penumbra.  SOLICITE ATENCIN MDICA SI: Una lesin en la cabeza puede ocasionar daos permanentes. Deber consultar con el mdico si ha tenido algunos de lo siguientes sntomas por ms de 3 semanas despus de la lesin o planea volver a hacer deportes:  Dolor de cabeza crnico.   Mareos o problemas para mantener el equilibrio.   Nuseas.   Problemas de la visin.   Mayor sensibilidad a ruidos o luces.   Depresin o cambios de humor.   Ansiedad o irritabilidad.   Problemas de memoria.   Dificultad para concentrase o prestar atencin.   Problemas para dormir.   Sentirse cansado [employer].  SOLICITE ATENCIN MDICA DE INMEDIATO SI: Ha sufrido un golpe o sacudida en la cabeza y usted (o sus familiares o amigos) notan:  Cefalea que empeora.   Debilitamiento (incluso de [place], pierna o una parte de la cara), adormecimiento o falta de coordinacin.   Vmitos a repeticin.   Mucho sueo o desmayos.   Uno de los centros negros del ojo (pupila) es ms grande que el otro.   Presenta convulsiones (temblores).   Habla arrastrando las palabras.   Aumento de la confusin, la agitacin o la irritabilidad.   Imposibilidad de Nutritional therapist o lugares.   Dolor en el cuello.   Dificultad para despertarse.   Cambios no habituales en la conducta.   Prdida de la conciencia  Los adultos [employer] con lesin cerebral poseen un mayor riesgo de Warehouse manager complicaciones graves como cogulos en el cerebro. Los dolores de cabeza que empeoran o un aumento en la confusin son sntomas de esta complicacin. Si aparecen estos sntomas vea a un mdico de inmediato. EST SEGURO QUE:   Comprende las instrucciones para el alta mdica.   Controlar su enfermedad.   Solicitar atencin mdica de inmediato segn las indicaciones.  PARA OBTENER MS INFORMACIN Hay grupos de ayuda para las personas que han sufrido una  lesin cerebral y su familia. Ellos proporcionan informacin y ayudan a la gente a ponerse en contacto con las fuentes de  locales. Estos incluyen grupos de apoyo, servicios de rehabilitacin y [place] gran variedad de profesionales del cuidado de Radiographer, therapeutic. [employer], la Brain Injury Association (BIA www.biausa.org) cuenta con oficinas en todo el pas que renen informacin cientfica y [place] y trabaja a nivel nacional para ayudar a las personas que han sufrido una lesin cerebral.  Document Released: 06/12/2008 Document Revised: 03/12/2011 [facility] Patient Information 2012 [place], [place]. Concussion and Brain Injury A blow or jolt to the head can disrupt the normal function of the brain. This type of brain injury is often called a "concussion" or a "closed head injury." Concussions are usually not life-threatening. Even so, the effects of a concussion can be serious.  CAUSES  A concussion is caused by a blunt blow to the head. The blow might be direct or indirect as described below.  Direct blow (running into another player during a soccer game, being hit in a fight, or hitting your head on a hard surface).   Indirect blow (when your head moves rapidly and violently back and forth like in a car crash).  SYMPTOMS  The brain is very complex. Every head injury is different. Some symptoms may appear right away. Other symptoms may not show up for days or weeks after the concussion. The signs of concussion can be hard to notice. Early on, problems may be missed by patients, family members, and caregivers. You may look fine even though you are acting or feeling differently.  These symptoms are usually temporary, but may last for days, weeks, or even longer. Symptoms include:  Mild headaches that will not go away.   Having more trouble than usual with:   Remembering things.   Paying attention or concentrating.   Organizing daily tasks.   Making decisions and solving problems.    Slowness in thinking, acting, speaking, or reading.   Getting lost or easily confused.   Feeling tired all the time or lacking energy (fatigue).   Feeling drowsy.   Sleep disturbances.   Sleeping more than usual.   Sleeping less than usual.   Trouble falling asleep.   Trouble sleeping (insomnia).   Loss of balance or feeling lightheaded or dizzy.   Nausea or vomiting.   Numbness or tingling.   Increased sensitivity to:   Sounds.   Lights.   Distractions.  Other symptoms might include:  Vision problems or eyes that tire easily.   Diminished sense of taste or smell.   Ringing in the ears.   Mood changes such as feeling sad, anxious, or listless.   Becoming easily irritated or angry for little or no reason.   Lack of motivation.  DIAGNOSIS  Your caregiver can usually diagnose a concussion or mild brain injury based on your description of your injury and your symptoms.  Your evaluation might include:  A brain scan to look for signs of injury to the brain. Even if the test shows no injury, you may still have a concussion.   Blood tests to be sure other problems are not present.  TREATMENT   People with a concussion need to be examined and evaluated. Most people with concussions are treated in an emergency department, urgent care, or clinic. Some people must stay in the hospital overnight for further treatment.   Your caregiver will send you home with important instructions to follow. Be sure to carefully follow them.   Tell your caregiver if you are already taking any medicines (prescription, over-the-counter, or natural remedies), or if you are drinking alcohol or taking illegal drugs. Also, talk with your caregiver if you are taking blood thinners (anticoagulants) or aspirin. These drugs may increase your chances of complications. All of this is important information that may affect treatment.   Only take over-the-counter or prescription medicines for pain,  discomfort, or fever as directed by your caregiver.  PROGNOSIS  How fast people recover from brain injury varies from person to person. Although most people have a good recovery, how quickly they improve depends on many factors. These factors include how severe their concussion was, what part of the brain was injured, their age, and how healthy they were before the concussion.  Because all head injuries are different, so is  recovery. Most people with mild injuries recover fully. Recovery can take time. In general, recovery is slower in older persons. Also, persons who have had a concussion in the past or have other medical problems may find that it takes longer to recover from their current injury. Anxiety and depression may also make it harder to adjust to the symptoms of brain injury. HOME CARE INSTRUCTIONS  Return to your normal activities slowly, not all at once. You must give your body and brain enough time for recovery.  Get plenty of sleep at night, and rest during the day. Rest helps the brain to heal.   Avoid staying up late at night.   Keep the same bedtime hours on weekends and weekdays.   Take daytime naps or rest breaks when you feel tired.   Limit activities that require a lot of thought or concentration (brain or cognitive rest). This includes:   Homework or job-related work.   Watching TV.   Computer work.   Avoid activities that could lead to a second brain injury, such as contact or recreational sports, until your caregiver says it is okay. Even after your brain injury has healed, you should protect yourself from having another concussion.   Ask your caregiver when you can return to your normal activities such as driving, bicycling, or operating heavy equipment. Your ability to react may be slower after a brain injury.   Talk with your caregiver about when you can return to work or school.   Inform your teachers, school nurse, school counselor, coach, Event organiser,  or work Production designer, theatre/television/film about your injury, symptoms, and restrictions. They should be instructed to report:   Increased problems with attention or concentration.   Increased problems remembering or learning new information.   Increased time needed to complete tasks or assignments.   Increased irritability or decreased ability to cope with stress.   Increased symptoms.   Take only those medicines that your caregiver has approved.   Do not drink alcohol until your caregiver says you are well enough to do so. Alcohol and certain other drugs may slow your recovery and can put you at risk of further injury.   If it is harder than usual to remember things, write them down.   If you are easily distracted, try to do one thing at a time. For example, do not try to watch TV while fixing dinner.   Talk with family members or close friends when making important decisions.   Keep all follow-up appointments. Repeated evaluation of your symptoms is recommended for your recovery.  PREVENTION  Protect your head from future injury. It is very important to avoid another head or brain injury before you have recovered. In rare cases, another injury has lead to permanent brain damage, brain swelling, or death. Avoid injuries by using:  Seatbelts when riding in a car.   Alcohol only in moderation.   A helmet when biking, skiing, skateboarding, skating, or doing similar activities.   Safety measures in your home.   Remove clutter and tripping hazards from floors and stairways.  Use g MEDICAL CARE IF:  A head injury can cause lingering symptoms. You should seek medical care if you have any of the following symptoms for more than 3 weeks after your injury or are planning to return to sports:  Chronic headaches.   Dizziness or balance problems.   Nausea.   Vision problems.   Increased sensitivity to noise or light.   Depression or mood swings.  Anxiety or irritability.   Memory problems.    Difficulty concentrating or paying attention.   Sleep problems.   Feeling tired all the time.  SEEK IMMEDIATE MEDICAL CARE IF:  You have had a blow or jolt to the head and you (or your family or friends) notice:  Severe or worsening headaches.   Weakness (even if only in one hand or one leg or one part of the face), numbness, or decreased coordination.   Repeated vomiting.   Increased sleepiness or passing out.   One black center of the eye (pupil) is larger than the other.   Convulsions (seizures).   Slurred speech. rab bars in bathrooms and handrails by stairs.   Place non-slip mats on floors and in bathtubs.   Improve lighting in dim areas.   SEEK  Increasing confusion, restlessness, agitation, or irritability.   Lack of ability to recognize people or places.   Neck pain.   Difficulty being awakened.   Unusual behavior changes.   Loss of consciousness.  Older adults with a brain injury may have a higher risk of serious complications such as a blood clot on the brain. Headaches that get worse or an increase in confusion are signs of this complication. If these signs occur, see a caregiver right away. MAKE SURE YOU:   Understand these instructions.   Will watch your condition.   Will get help right away if you are not doing well or get worse.  FOR MORE INFORMATION  Several groups help people with brain injury and their families. They provide information and put people in touch with local resources. These include support groups, rehabilitation services, and a variety of health care professionals. Among these groups, the Brain Injury Association (BIA, www.biausa.org) has a Secretary/administrator that gathers scientific and educational information and works on a national level to help people with brain injury.  Document Released: 09/20/2003 Document Revised: 03/12/2011 Document Reviewed: 02/16/2008 [facility] Patient Information 2012 [place], [place].

## 2011-08-26 NOTE — ED Notes (Signed)
Pt states that he is continuing to have headache and dizziness.

## 2011-08-28 NOTE — ED Provider Notes (Signed)
Medical screening examination/treatment/procedure(s) were performed by non-physician practitioner and as supervising physician I was immediately available for consultation/collaboration.  Antoine Vandermeulen P Ashlin Kreps, MD 08/28/11 1531 

## 2011-08-30 NOTE — ED Provider Notes (Signed)
Evaluation and management procedures were performed by me prior to passing care of the patient to the mid-level provider in the CDU. Patient management continued under my collaboration and supervision as performed by the mid-level provider.   Felisa Bonier, MD 08/30/11 [phone]

## 2018-12-30 ENCOUNTER — Telehealth: Payer: Self-pay | Admitting: *Deleted

## 2018-12-30 DIAGNOSIS — Z20822 Contact with and (suspected) exposure to covid-19: Secondary | ICD-10-CM

## 2018-12-30 NOTE — Telephone Encounter (Signed)
Referred by [facility] Department for [phone] testing due to having symptoms. No answer, left VM to return call to make appointment time at the [place] site. Lab order already placed. No PCP No insurance.

## 2019-01-06 ENCOUNTER — Emergency Department
Admission: EM | Admit: 2019-01-06 | Discharge: 2019-01-06 | Disposition: A | Discharge status: Home / Self Care | Payer: Self-pay | Attending: Emergency Medicine | Admitting: Emergency Medicine

## 2019-01-06 ENCOUNTER — Encounter: Payer: Self-pay | Admitting: Emergency Medicine

## 2019-01-06 ENCOUNTER — Emergency Department: Payer: Self-pay

## 2019-01-06 ENCOUNTER — Other Ambulatory Visit: Payer: Self-pay

## 2019-01-06 DIAGNOSIS — R509 Fever, unspecified: Secondary | ICD-10-CM | POA: Insufficient documentation

## 2019-01-06 DIAGNOSIS — R05 Cough: Secondary | ICD-10-CM | POA: Insufficient documentation

## 2019-01-06 DIAGNOSIS — R059 Cough, unspecified: Secondary | ICD-10-CM

## 2019-01-06 DIAGNOSIS — R0602 Shortness of breath: Secondary | ICD-10-CM | POA: Insufficient documentation

## 2019-01-06 DIAGNOSIS — U071 COVID-19: Secondary | ICD-10-CM | POA: Insufficient documentation

## 2019-01-06 LAB — CBC
HCT: 45.5 % (ref 39.0–52.0)
Hemoglobin: 15.5 g/dL (ref 13.0–17.0)
MCH: 29.1 pg (ref 26.0–34.0)
MCHC: 34.1 g/dL (ref 30.0–36.0)
MCV: 85.5 fL (ref 80.0–100.0)
Platelets: 212 10*3/uL (ref 150–400)
RBC: 5.32 MIL/uL (ref 4.22–5.81)
RDW: 12.6 % (ref 11.5–15.5)
WBC: 5.8 10*3/uL (ref 4.0–10.5)
nRBC: 0 % (ref 0.0–0.2)

## 2019-01-06 LAB — BASIC METABOLIC PANEL
Anion gap: 11 (ref 5–15)
BUN: 13 mg/dL (ref 6–20)
CO2: 23 mmol/L (ref 22–32)
Calcium: 8.9 mg/dL (ref 8.9–10.3)
Chloride: 101 mmol/L (ref 98–111)
Creatinine, Ser: 0.94 mg/dL (ref 0.61–1.24)
GFR calc Af Amer: >60 mL/min (ref >60)
GFR calc non Af Amer: >60 mL/min (ref >60)
Glucose, Bld: 110 mg/dL — ABNORMAL HIGH (ref 70–99)
Potassium: 3.7 mmol/L (ref 3.5–5.1)
Sodium: 135 mmol/L (ref 135–145)

## 2019-01-06 LAB — TROPONIN I (HIGH SENSITIVITY): Troponin I (High Sensitivity): <2 ng/L (ref <18)

## 2019-01-06 NOTE — ED Triage Notes (Signed)
Pt presents to ED c/o SOB and chest pain. Positive COVID test 8 days ago.

## 2019-01-06 NOTE — ED Notes (Signed)
Pt ambulatory from triage, sitting in bed speaking with this RN via ipad interpretor # [id number]. Pt reports he was tested for covid 8 days ago at a clinic that he does not know the name of. Here today because he is experiencing SOB and CP.

## 2019-01-06 NOTE — ED Provider Notes (Signed)
[facility] Emergency Department Provider Note  ____________________________________________   I have reviewed the triage vital signs and the nursing notes. Where available I have reviewed prior notes and, if possible and indicated, outside hospital notes.    HISTORY  Chief Complaint Chest Pain and Shortness of Breath (COVID POS)  Patient seen and evaluated during the coronavirus epidemic during a time with low staffing patient speaks Spanish, he is very comfortable with my Spanish he does not want me to call for an interpreter at this time, he states that if he changes his mind he will let me know.  he feels that there are no barriers to communication between us at this time  HPI Luis Bartlett is a 38 y.o. male with a history of recent diagnosis of coronavirus as an outpatient, about 7 days ago he states.  He has continued low-grade fever and cough at home.  Nonproductive cough.  And sometimes is uncomfortable to his left chest wall when he coughs.  No other complaints.  Pain is nonradiating gets better when he takes Tylenol.  He is self quarantining he understands he must continue to do so.  Nonradiating cough elicited chest wall discomfort on the left side.   Has been there for a few days but only when he coughs.  No exertional symptoms otherwise feels pretty good otherwise he states  History reviewed. No pertinent past medical history.  There are no active problems to display for this patient.   History reviewed. No pertinent surgical history.  Prior to Admission medications   Medication Sig Start Date End Date Taking? Authorizing Provider  cyclobenzaprine (FLEXERIL) 10 MG tablet Take 10 mg by mouth every 6 (six) hours as needed. For muscle spasms    [provider]  HYDROcodone-acetaminophen (NORCO) 5-325 MG per tablet Take 1-2 tablets by mouth every 6 (six) hours as needed. For pain    Hunt, Meagan, MD  meloxicam (MOBIC) 7.5 MG tablet Take 7.5 mg  by mouth 2 (two) times daily as needed. For pain    [provider]    Allergies Patient has no known allergies.  History reviewed. No pertinent family history.  Social History Social History   Tobacco Use  . Smoking status: Never Smoker  . Smokeless tobacco: Never Used  Substance Use Topics  . Alcohol use: Yes  . Drug use: No    Review of Systems Constitutional: + fever Eyes: No visual changes. ENT: No sore throat. No stiff neck no neck pain Cardiovascular: Denies chest pain. Respiratory: Denies shortness of breath. Gastrointestinal:   no vomiting.  No diarrhea.  No constipation. Genitourinary: Negative for dysuria. Musculoskeletal: Negative lower extremity swelling Skin: Negative for rash. Neurological: Negative for severe headaches, focal weakness or numbness.   ____________________________________________   PHYSICAL EXAM:  VITAL SIGNS: ED Triage Vitals  Enc Vitals Group     BP 01/06/19 1605 (!) 151/96     Pulse Rate 01/06/19 1605 87     Resp 01/06/19 1605 18     Temp 01/06/19 1605 99.6 F (37.6 C)     Temp Source 01/06/19 1605 Oral     SpO2 01/06/19 1605 97 %     Weight 01/06/19 1607 195 lb (88.5 kg)     Height 01/06/19 1607 5\' 6"  (1.676 m)     Head Circumference --      Peak Flow --      Pain Score 01/06/19 1606 5     Pain Loc --  Pain Edu? --      Excl. in [place]? --     Constitutional: Alert and oriented. Well appearing and in no acute distress. Eyes: Conjunctivae are normal Head: Atraumatic HEENT: No congestion/rhinnorhea. Mucous membranes are moist.  Oropharynx non-erythematous Neck:   Nontender with no meningismus, no masses, no stridor Chest: Tender palpation left chest mono test is very patient states "ouch that the pain right there" and pulls back.  No crepitus no flail chest no lesions noted. Cardiovascular: Normal rate, regular rhythm. Grossly normal heart sounds.  Good peripheral circulation. Respiratory: Normal respiratory  effort.  No retractions. Lungs CTAB. Abdominal: Soft and nontender. No distention. No guarding no rebound Back:  There is no focal tenderness or step off.  there is no midline tenderness there are no lesions noted. there is no CVA tenderness Musculoskeletal: No lower extremity tenderness, no upper extremity tenderness. No joint effusions, no DVT signs strong distal pulses no edema Neurologic:  Normal speech and language. No gross focal neurologic deficits are appreciated.  Skin:  Skin is warm, dry and intact. No rash noted. Psychiatric: Mood and affect are normal. Speech and behavior are normal.  ____________________________________________   LABS (all labs ordered are listed, but only abnormal results are displayed)  Labs Reviewed  BASIC METABOLIC PANEL - Abnormal; Notable for the following components:      Result Value   Glucose, Bld 110 (*)    All other components within normal limits  CBC  TROPONIN I (HIGH SENSITIVITY)    Pertinent labs  results that were available during my care of the patient were reviewed by me and considered in my medical decision making (see chart for details). ____________________________________________  EKG  I personally interpreted any EKGs ordered by me or triage Normal sinus rhythm, rate 83 bpm no acute ST elevation depression normal axis unremarkable EKG ____________________________________________  RADIOLOGY  Pertinent labs & imaging results that were available during my care of the patient were reviewed by me and considered in my medical decision making (see chart for details). If possible, patient and/or family made aware of any abnormal findings.  Dg Chest Port 1 View  Result Date: 01/06/2019 CLINICAL DATA:  38 year old male with a history of increased cough and shortness of breath EXAM: PORTABLE CHEST 1 VIEW COMPARISON:  CT 08/19/2011, 08/19/2011 FINDINGS: Cardiomediastinal silhouette unchanged in size and contour. No evidence of  pneumothorax. No pleural effusion. No confluent airspace disease. Low lung volumes. No displaced fracture. 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 IMPRESSION: Low lung volumes without evidence of acute cardiopulmonary disease Electronically Signed   By: Corrie Mckusick D.O.   On: 01/06/2019 17:01   ____________________________________________    PROCEDURES  Procedure(s) performed: None  Procedures  Critical Care performed: None  ____________________________________________   INITIAL IMPRESSION / ASSESSMENT AND PLAN / ED COURSE  Pertinent labs & imaging results that were available during my care of the patient were reviewed by me and considered in my medical decision making (see chart for details).  Patient here with coronavirus positive as an outpatient he states with symptoms of coronavirus but no evidence of the need for intubation or admission.  Sats are in the 100% at this time, breathing comfortably, chest x-ray is reassuring, we have given him reassurance that this is the normal progression of the disease thus far, extensive return precautions follow-up given understood he understands he must continue to quarantine himself and he is very comfortable with this.  ____________________________________________   FINAL CLINICAL IMPRESSION(S) / ED DIAGNOSES  Final diagnoses:  Cough      This chart was dictated using voice recognition software.  Despite best efforts to proofread,  errors can occur which can change meaning.      Jeanmarie PlantMcShane, Nala Kachel A, MD 01/06/19 [phone]

## 2020-04-28 IMAGING — DX PORTABLE CHEST - 1 VIEW
1 series · 1 of 1 positions shown · non-contrast
Comparison: CT 08/19/2011, 08/19/2011

CLINICAL DATA: 38-year-old male with a history of increased cough
and shortness of breath

EXAM:
PORTABLE CHEST 1 VIEW

[chest ap]
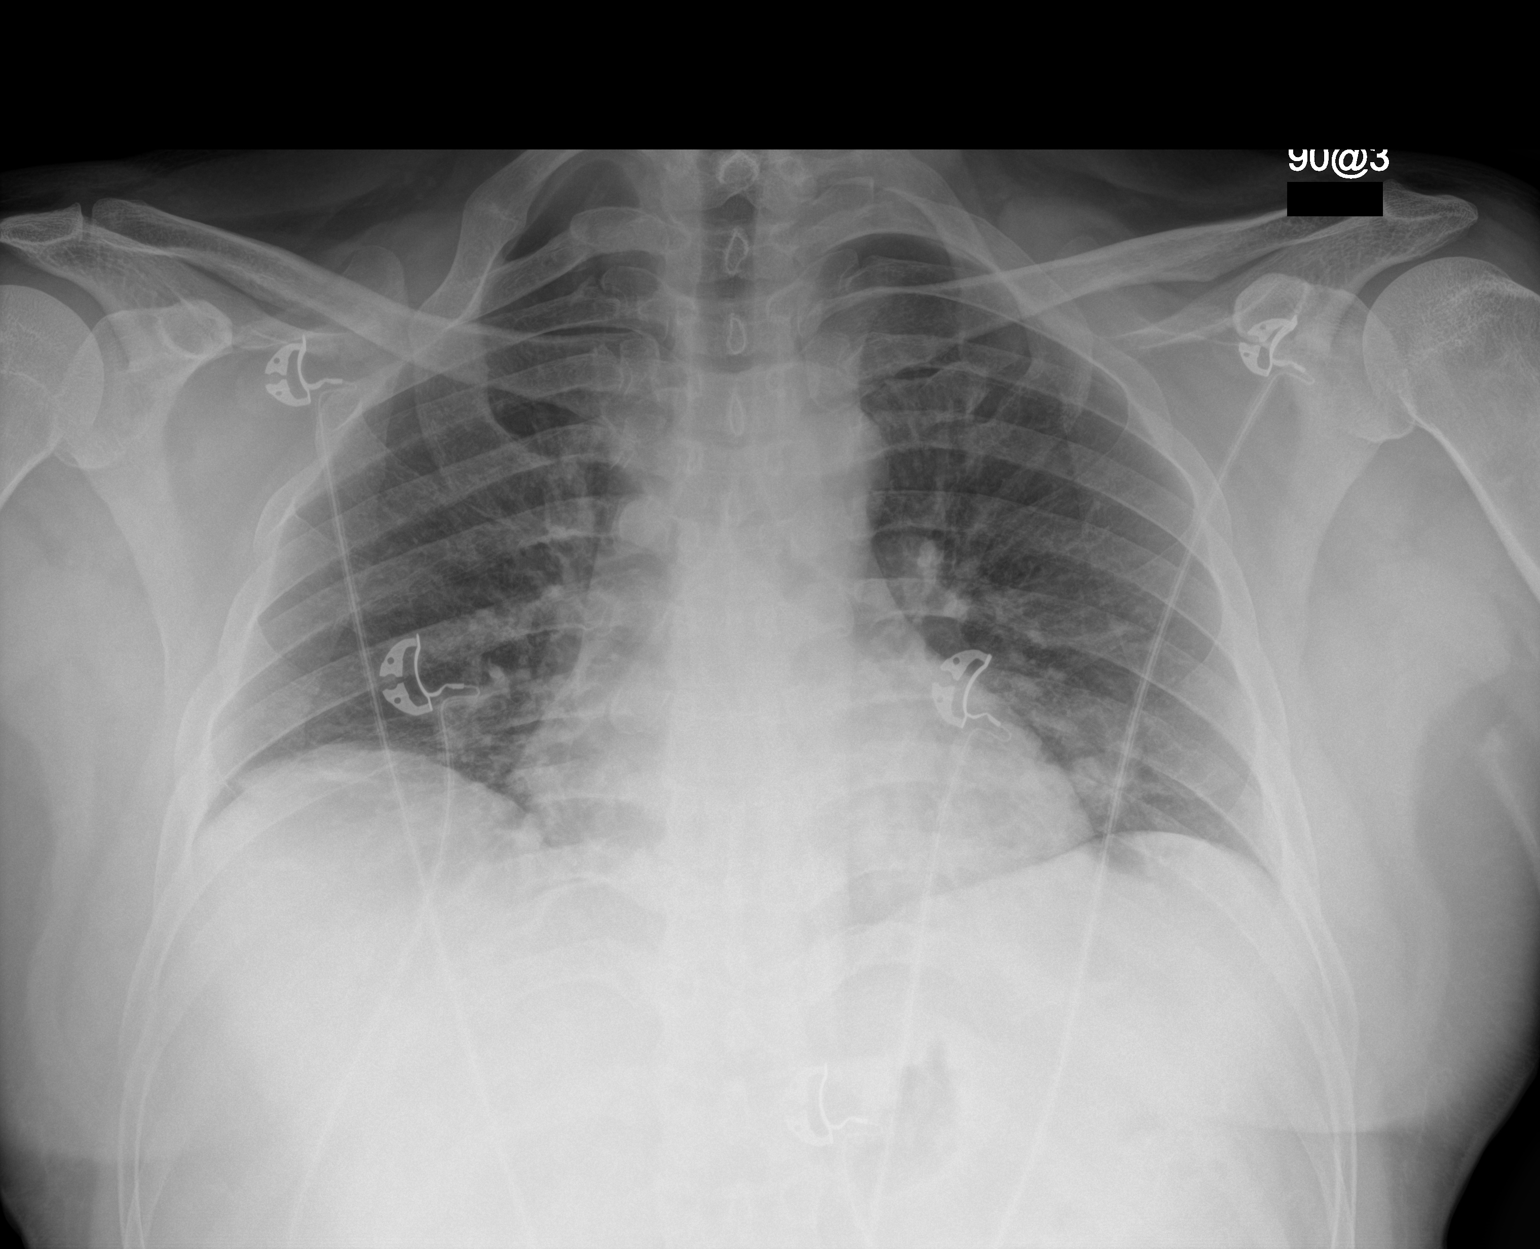

[1 of 1 positions shown; findings below may reference images not displayed]

FINDINGS: Cardiomediastinal silhouette unchanged in size and contour. No
evidence of pneumothorax. No pleural effusion. No confluent airspace
disease. Low lung volumes.

No displaced fracture.

Similar appearance of congenital fusion of the right first rib with
the right second rib.
IMPRESSION: Low lung volumes without evidence of acute cardiopulmonary disease

## 2023-02-26 ENCOUNTER — Other Ambulatory Visit: Payer: Self-pay

## 2023-02-26 ENCOUNTER — Ambulatory Visit
Admission: EM | Admit: 2023-02-26 | Discharge: 2023-02-26 | Disposition: A | Discharge status: Home / Self Care | Payer: Self-pay | Attending: Physician Assistant | Admitting: Physician Assistant

## 2023-02-26 DIAGNOSIS — S39012A Strain of muscle, fascia and tendon of lower back, initial encounter: Secondary | ICD-10-CM | POA: Diagnosis not present

## 2023-02-26 MED ORDER — LIDOCAINE 5 % EX PTCH: 1.0000 | MEDICATED_PATCH | CUTANEOUS | 0 refills | Status: AC

## 2023-02-26 MED ORDER — METHOCARBAMOL 500 MG PO TABS: 500.0000 mg | ORAL_TABLET | Freq: Two times a day (BID) | ORAL | 0 refills | Status: AC

## 2023-02-26 MED ORDER — DICLOFENAC SODIUM 75 MG PO TBEC: 75.0000 mg | DELAYED_RELEASE_TABLET | Freq: Two times a day (BID) | ORAL | 0 refills | Status: AC

## 2023-02-26 NOTE — Discharge Instructions (Signed)
I believe that you have injured the muscles in your back.  Please start Diclofenac twice a day.  Do not take other NSAIDs with this medication including aspirin, ibuprofen/Advil, naproxen/Aleve.  You can use acetaminophen/Tylenol for breakthrough pain.  Take Robaxin up to 2 times a day.  This will make you sleepy so do not drive or drink alcohol taking it.  Apply lidocaine patch for 12 hours during the day; remove this for 12 hours at night.  Use only 1 patch per 24 hours.  I recommend you follow-up with orthopedics; call to schedule an appointment.  If anything worsens and you have severe pain, going to the bathroom on yourself without noticing it, numbness or tingling in your legs, weakness in your legs you need to be seen immediately.

## 2023-02-26 NOTE — ED Triage Notes (Signed)
Pt states he was lifting furniture yesterday and is now having low back pain.  Pt fell through roof several years ago and has hx of multiple fx in back.

## 2023-02-26 NOTE — ED Provider Notes (Signed)
[facility]    CSN: [id number] Arrival date & time: 02/26/23  1228      History   Chief Complaint Chief Complaint  Patient presents with   Back Pain    HPI Luis Bartlett is a 42 y.o. male.   Patient presents today with a several day history of lumbar back pain.  He reports symptoms began after he was lifting furniture.  He denies any additional injury or change in activity before symptoms began including recent trauma or fall.  Reports that approximately 7 years ago he did fall from roof and had multiple fractures including in his spine.  He did not have any ongoing sequela and did not require surgery at this time.  He denies any fever, nausea, vomiting.  Denies any associated weakness, numbness, paresthesias, bowel/bladder incontinence.  He has tried ibuprofen with temporary improvement of symptoms.  He denies any personal history of malignancy.  He is able to ambulate without assistance.  He does report that movement significantly exacerbated symptoms.    History reviewed. No pertinent past medical history.  There are no problems to display for this patient.   Past Surgical History:  Procedure Laterality Date   APPENDECTOMY     BACK SURGERY         Home Medications    Prior to Admission medications   Medication Sig Start Date End Date Taking? Authorizing Provider  diclofenac (VOLTAREN) 75 MG EC tablet Take 1 tablet (75 mg total) by mouth 2 (two) times daily. 02/26/23  Yes Nino Amano K, PA-C  lidocaine (LIDODERM) 5 % Place 1 patch onto the skin daily. Remove & Discard patch within 12 hours or as directed by MD 02/26/23  Yes Malaya Cagley, Noberto Retort, PA-C  methocarbamol (ROBAXIN) 500 MG tablet Take 1 tablet (500 mg total) by mouth 2 (two) times daily. 02/26/23  Yes Odie Rauen, Noberto Retort, PA-C    Family History History reviewed. No pertinent family history.  Social History Social History   Tobacco Use   Smoking status: Never   Smokeless tobacco: Never  Substance Use  Topics   Alcohol use: Yes   Drug use: No     Allergies   Patient has no known allergies.   Review of Systems Review of Systems  Constitutional:  Positive for activity change. Negative for appetite change, fatigue and fever.  Respiratory:  Negative for cough and shortness of breath.   Cardiovascular:  Negative for chest pain.  Gastrointestinal:  Negative for abdominal pain, diarrhea, nausea and vomiting.  Genitourinary:  Negative for difficulty urinating, dysuria, frequency and urgency.  Musculoskeletal:  Positive for back pain. Negative for arthralgias and myalgias.  Neurological:  Negative for dizziness, weakness, light-headedness, numbness and headaches.     Physical Exam Triage Vital Signs ED Triage Vitals  Encounter Vitals Group     BP 02/26/23 1327 (!) 156/70     Systolic BP Percentile --      Diastolic BP Percentile --      Pulse Rate 02/26/23 1327 61     Resp 02/26/23 1327 18     Temp 02/26/23 1327 98.1 F (36.7 C)     Temp Source 02/26/23 1327 Oral     SpO2 02/26/23 1327 98 %     Weight --      Height --      Head Circumference --      Peak Flow --      Pain Score 02/26/23 1324 7     Pain Loc --  Pain Education --      Exclude from Growth Chart --    No data found.  Updated Vital Signs BP (!) 156/70   Pulse 61   Temp 98.1 F (36.7 C) (Oral)   Resp 18   SpO2 98%   Visual Acuity Right Eye Distance:   Left Eye Distance:   Bilateral Distance:    Right Eye Near:   Left Eye Near:    Bilateral Near:     Physical Exam Vitals reviewed.  Constitutional:      General: He is awake.     Appearance: Normal appearance. He is well-developed. He is not ill-appearing.     Comments: Very pleasant male appears stated age in no acute distress sitting comfortably in exam room  HENT:     Head: Normocephalic and atraumatic.     Mouth/Throat:     Pharynx: Uvula midline. No oropharyngeal exudate or posterior oropharyngeal erythema.  Cardiovascular:     Rate  and Rhythm: Normal rate and regular rhythm.     Heart sounds: Normal heart sounds, S1 normal and S2 normal. No murmur heard. Pulmonary:     Effort: Pulmonary effort is normal.     Breath sounds: Normal breath sounds. No stridor. No wheezing, rhonchi or rales.     Comments: Clear to auscultation bilaterally Abdominal:     General: Bowel sounds are normal.     Palpations: Abdomen is soft.     Tenderness: There is no abdominal tenderness. There is no right CVA tenderness, left CVA tenderness, guarding or rebound.  Musculoskeletal:     Cervical back: No tenderness or bony tenderness.     Thoracic back: No tenderness or bony tenderness.     Lumbar back: Spasms and tenderness present. No bony tenderness. Negative right straight leg raise test and negative left straight leg raise test.     Comments: Back: No pain percussion of vertebrae.  No deformity or step-off noted.  Tenderness palpation of bilateral lumbar paraspinal muscles.  Spasm noted on right.  Strength 5/5 bilateral lower extremities.    Neurological:     Mental Status: He is alert.  Psychiatric:        Behavior: Behavior is cooperative.      UC Treatments / Results  Labs (all labs ordered are listed, but only abnormal results are displayed) Labs Reviewed - No data to display  EKG   Radiology No results found.  Procedures Procedures (including critical care time)  Medications Ordered in UC Medications - No data to display  Initial Impression / Assessment and Plan / UC Course  I have reviewed the triage vital signs and the nursing notes.  Pertinent labs & imaging results that were available during my care of the patient were reviewed by me and considered in my medical decision making (see chart for details).     Patient is well-appearing, afebrile, nontoxic, nontachycardic.  Plain films were deferred as he denies any recent trauma and has no focal bony tenderness or step-off on exam.  Suspect muscular etiology of  symptoms.  He was started on diclofenac for pain with instruction not to take additional NSAIDs with this medication due to risk of GI bleeding.  He can use Robaxin up to twice a day for additional pain relief and we discussed that this can be sedating and he is not to drive or drink alcohol taking it.  Additional prescription for lidocaine patches were sent to pharmacy.  Given his history of previous trauma discussed that  if his symptoms not improving quickly he should follow-up with orthopedics for further evaluation and management.  He was given contact information for local provider with instruction to call to schedule an appointment.  Discussed that if anything worsens or changes that he has increasing pain, weakness, numbness, paresthesias, bowel/bladder incontinence he needs to be seen emergently.  Strict return precautions given.  Excuse note provided.  Final Clinical Impressions(s) / UC Diagnoses   Final diagnoses:  Strain of lumbar region, initial encounter     Discharge Instructions      I believe that you have injured the muscles in your back.  Please start Diclofenac twice a day.  Do not take other NSAIDs with this medication including aspirin, ibuprofen/Advil, naproxen/Aleve.  You can use acetaminophen/Tylenol for breakthrough pain.  Take Robaxin up to 2 times a day.  This will make you sleepy so do not drive or drink alcohol taking it.  Apply lidocaine patch for 12 hours during the day; remove this for 12 hours at night.  Use only 1 patch per 24 hours.  I recommend you follow-up with orthopedics; call to schedule an appointment.  If anything worsens and you have severe pain, going to the bathroom on yourself without noticing it, numbness or tingling in your legs, weakness in your legs you need to be seen immediately.      ED Prescriptions     Medication Sig Dispense Auth. Provider   diclofenac (VOLTAREN) 75 MG EC tablet Take 1 tablet (75 mg total) by mouth 2 (two) times daily. 20  tablet Lyrik Buresh K, PA-C   methocarbamol (ROBAXIN) 500 MG tablet Take 1 tablet (500 mg total) by mouth 2 (two) times daily. 20 tablet Crestina Strike K, PA-C   lidocaine (LIDODERM) 5 % Place 1 patch onto the skin daily. Remove & Discard patch within 12 hours or as directed by MD 30 patch Tattiana Fakhouri K, PA-C      I have reviewed the PDMP during this encounter.   Jeani Hawking, PA-C 02/26/23 1349
